# Patient Record
Sex: Female | Born: 1995 | Race: Black or African American | Hispanic: No | Marital: Single | Smoking: Never smoker
Health system: Southern US, Community
[De-identification: ages and names within clinical notes are randomized; demographics above are authoritative.]

## PROBLEM LIST (undated history)

## (undated) ENCOUNTER — Inpatient Hospital Stay (HOSPITAL_COMMUNITY): Payer: Self-pay

## (undated) DIAGNOSIS — G40909 Epilepsy, unspecified, not intractable, without status epilepticus: Secondary | ICD-10-CM

## (undated) DIAGNOSIS — R569 Unspecified convulsions: Secondary | ICD-10-CM

## (undated) DIAGNOSIS — H9190 Unspecified hearing loss, unspecified ear: Secondary | ICD-10-CM

## (undated) HISTORY — PX: NO PAST SURGERIES: SHX2092

---
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
## 2013-01-01 NOTE — ED Provider Notes (Signed)
Medical screening examination/treatment/procedure(s) were performed by non-physician practitioner and as supervising physician I was immediately available for consultation/collaboration.  Doug Sou, MD 01/01/13 1015

## 2015-08-02 ENCOUNTER — Inpatient Hospital Stay (HOSPITAL_COMMUNITY)
Admission: AD | Admit: 2015-08-02 | Discharge: 2015-08-02 | Disposition: A | Discharge status: Home / Self Care | Payer: Medicaid Other | Source: Ambulatory Visit | Attending: Family Medicine | Admitting: Family Medicine

## 2015-08-02 ENCOUNTER — Encounter (HOSPITAL_COMMUNITY): Payer: Self-pay | Admitting: *Deleted

## 2015-08-02 DIAGNOSIS — O2341 Unspecified infection of urinary tract in pregnancy, first trimester: Secondary | ICD-10-CM | POA: Insufficient documentation

## 2015-08-02 DIAGNOSIS — O219 Vomiting of pregnancy, unspecified: Secondary | ICD-10-CM

## 2015-08-02 DIAGNOSIS — Z3A01 Less than 8 weeks gestation of pregnancy: Secondary | ICD-10-CM | POA: Insufficient documentation

## 2015-08-02 DIAGNOSIS — R112 Nausea with vomiting, unspecified: Secondary | ICD-10-CM | POA: Insufficient documentation

## 2015-08-02 DIAGNOSIS — G40909 Epilepsy, unspecified, not intractable, without status epilepticus: Secondary | ICD-10-CM | POA: Insufficient documentation

## 2015-08-02 DIAGNOSIS — O26891 Other specified pregnancy related conditions, first trimester: Secondary | ICD-10-CM | POA: Insufficient documentation

## 2015-08-02 LAB — URINALYSIS, ROUTINE W REFLEX MICROSCOPIC
GLUCOSE, UA: NEGATIVE mg/dL
NITRITE: NEGATIVE
PH: 6 (ref 5.0–8.0)
PROTEIN: 30 mg/dL — AB
Specific Gravity, Urine: >1.03 — ABNORMAL HIGH (ref 1.005–1.030)

## 2015-08-02 LAB — POCT PREGNANCY, URINE: Preg Test, Ur: POSITIVE — AB

## 2015-08-02 LAB — URINE MICROSCOPIC-ADD ON

## 2015-08-02 MED ORDER — PROMETHAZINE HCL 12.5 MG PO TABS: 12.5000 mg | ORAL_TABLET | Freq: Every day | ORAL | Status: AC

## 2015-08-02 MED ORDER — METOCLOPRAMIDE HCL 10 MG PO TABS: 10.0000 mg | ORAL_TABLET | Freq: Three times a day (TID) | ORAL | Status: AC

## 2015-08-02 MED ORDER — DEXTROSE 5 % IN LACTATED RINGERS IV BOLUS
1000.0000 mL | Freq: Once | INTRAVENOUS | Status: AC
  Administered 2015-08-02: 1000 mL via INTRAVENOUS

## 2015-08-02 MED ORDER — PROMETHAZINE HCL 25 MG/ML IJ SOLN
12.5000 mg | Freq: Once | INTRAMUSCULAR | Status: AC
  Administered 2015-08-02: 12.5 mg via INTRAVENOUS
  Filled 2015-08-02: qty 1

## 2015-08-02 MED ORDER — CEPHALEXIN 500 MG PO CAPS: 500.0000 mg | ORAL_CAPSULE | Freq: Four times a day (QID) | ORAL | Status: AC

## 2015-08-02 MED ORDER — M.V.I. ADULT IV INJ
Freq: Once | INTRAVENOUS | Status: AC
  Administered 2015-08-02: 20:00:00 via INTRAVENOUS
  Filled 2015-08-02: qty 1000

## 2015-08-02 NOTE — Discharge Instructions (Signed)

## 2015-08-02 NOTE — MAU Note (Addendum)
Been nauseated, throwing up several times a day, started 1-2 wks ago.  +HPT week ago.  Wanting to see if she is pregnant and see if everything is ok.

## 2015-08-02 NOTE — MAU Provider Note (Signed)
History     CSN: [id number]  Arrival date and time: 08/02/15 1705   None     Chief Complaint  Patient presents with  . Nausea  . Possible Pregnancy   HPI   Becky Harrison is a 20 yo G1P0 at [redacted]w[redacted]d gestation presenting for severe nausea and vomiting for the past two weeks. She admits to anorexia, heartburn and fever, but denies abdominal pain, HA, dizziness, or changes in urination. She had a +HPT two weeks ago and is wondering today if her vomiting can be attributed to a pregnancy.   She denies vaginal bleeding.  She has not started prenatal care.   OB History    Gravida Para Term Preterm AB TAB SAB Ectopic Multiple Living   1               Past Medical History  Diagnosis Date  . Seizures (HCC)   . Hearing impaired   . Epilepsia [facility])     Past Surgical History  Procedure Laterality Date  . No past surgeries      Family History  Problem Relation Age of Onset  . Asthma Brother     Social History  Substance Use Topics  . Smoking status: Never Smoker   . Smokeless tobacco: None  . Alcohol Use: No    Allergies: No Known Allergies  Prescriptions prior to admission  Medication Sig Dispense Refill Last Dose  . acetaminophen (TYLENOL) 325 MG tablet Take 650 mg by mouth every 6 (six) hours as needed for pain.   Past Month at Unknown  . ibuprofen (ADVIL,MOTRIN) 200 MG tablet Take 200 mg by mouth every 6 (six) hours as needed for pain.   Past Month at Unknown  . naproxen sodium (ANAPROX) 220 MG tablet Take 220 mg by mouth daily as needed (for pain).   Past Month at Unknown  . triamcinolone (KENALOG) 0.025 % ointment Apply topically 2 (two) times daily. Do not apply to face 15 g 1    Results for orders placed or performed during the hospital encounter of 08/02/15 (from the past 48 hour(s))  Urinalysis, Routine w reflex microscopic (not at [facility])     Status: Abnormal   Collection Time: 08/02/15  6:10 PM  Result Value Ref Range   Color, Urine YELLOW YELLOW   APPearance  HAZY (A) CLEAR   Specific Gravity, Urine >1.030 (H) 1.005 - 1.030   pH 6.0 5.0 - 8.0   Glucose, UA NEGATIVE NEGATIVE mg/dL   Hgb urine dipstick TRACE (A) NEGATIVE   Bilirubin Urine SMALL (A) NEGATIVE   Ketones, ur >80 (A) NEGATIVE mg/dL   Protein, ur 30 (A) NEGATIVE mg/dL   Nitrite NEGATIVE NEGATIVE   Leukocytes, UA MODERATE (A) NEGATIVE  Urine microscopic-add on     Status: Abnormal   Collection Time: 08/02/15  6:10 PM  Result Value Ref Range   Squamous Epithelial / LPF 0-5 (A) NONE SEEN   WBC, UA 0-5 0 - 5 WBC/hpf   RBC / HPF 0-5 0 - 5 RBC/hpf   Bacteria, UA MANY (A) NONE SEEN   Urine-Other MUCOUS PRESENT   Pregnancy, urine POC     Status: Abnormal   Collection Time: 08/02/15  6:17 PM  Result Value Ref Range   Preg Test, Ur POSITIVE (A) NEGATIVE    Comment:        THE SENSITIVITY OF THIS METHODOLOGY IS >24 mIU/mL     Review of Systems  Constitutional: Positive for fever. Negative for chills.  Respiratory: Negative for cough and shortness of breath.   Gastrointestinal: Positive for heartburn, nausea and vomiting.  Genitourinary: Positive for dysuria, urgency and frequency. Negative for flank pain.  Musculoskeletal: Negative for back pain.  Neurological: Negative for dizziness and headaches.   Physical Exam   Blood pressure 113/56, pulse 66, temperature 98.3 F (36.8 C), temperature source Oral, resp. rate 18, height  (1.6 m), weight 137 lb (62.143 kg), last menstrual period 06/08/2015.  Physical Exam  Constitutional: She is oriented to person, place, and time. She appears well-developed and well-nourished. No distress.  HENT:  Head: Normocephalic.  Cardiovascular: Normal rate and normal heart sounds.   Respiratory: Effort normal and breath sounds normal. No respiratory distress.  GI: Soft. She exhibits no distension. There is no tenderness. There is no rebound.  Musculoskeletal: Normal range of motion.  Neurological: She is alert and oriented to person, place,  and time.  Skin: Skin is warm. She is not diaphoretic.  Psychiatric: Her behavior is normal.    MAU Course  Procedures  None  MDM  Urine culture pending D5 bolus Multivitamins bolus.  Phenergan 12.5 mg IV Pepcid IV  Report to Becky Harrison CNM who resumes care of the patient   Becky Lope, NP   Assessment and Plan   A:  1. UTI in pregnancy, first trimester   2. Nausea and vomiting during pregnancy      P: DC home Comfort measures reviewed  1st Trimester precautions  RX: Reglan, Keflex, Phenergan at night only  Follow-up Information    Schedule an appointment as soon as possible for a visit with [facility].   Specialty:  Obstetrics and Gynecology   Contact information:   [address] [place] [address] [phone]

## 2015-08-04 LAB — CULTURE, OB URINE: Special Requests: NORMAL

## 2015-08-31 ENCOUNTER — Encounter: Payer: Medicaid Other | Admitting: Family

## 2015-08-31 ENCOUNTER — Telehealth: Payer: Self-pay | Admitting: Family Medicine

## 2015-08-31 NOTE — Telephone Encounter (Signed)
Called patient about missed appointment. Left a message for her to call about an appointment she missed.

## 2015-09-20 ENCOUNTER — Encounter: Payer: Medicaid Other | Admitting: Student

## 2015-09-21 ENCOUNTER — Encounter (HOSPITAL_COMMUNITY): Payer: Self-pay | Admitting: *Deleted

## 2015-09-21 ENCOUNTER — Inpatient Hospital Stay (HOSPITAL_COMMUNITY)
Admission: AD | Admit: 2015-09-21 | Discharge: 2015-09-21 | Disposition: A | Discharge status: Home / Self Care | Payer: Self-pay | Source: Ambulatory Visit | Attending: Family Medicine | Admitting: Family Medicine

## 2015-09-21 ENCOUNTER — Inpatient Hospital Stay (HOSPITAL_COMMUNITY): Payer: Self-pay

## 2015-09-21 DIAGNOSIS — Z3A08 8 weeks gestation of pregnancy: Secondary | ICD-10-CM | POA: Insufficient documentation

## 2015-09-21 DIAGNOSIS — O26891 Other specified pregnancy related conditions, first trimester: Secondary | ICD-10-CM | POA: Insufficient documentation

## 2015-09-21 DIAGNOSIS — R109 Unspecified abdominal pain: Secondary | ICD-10-CM

## 2015-09-21 DIAGNOSIS — H9190 Unspecified hearing loss, unspecified ear: Secondary | ICD-10-CM | POA: Insufficient documentation

## 2015-09-21 DIAGNOSIS — O209 Hemorrhage in early pregnancy, unspecified: Secondary | ICD-10-CM | POA: Insufficient documentation

## 2015-09-21 DIAGNOSIS — O021 Missed abortion: Secondary | ICD-10-CM | POA: Insufficient documentation

## 2015-09-21 DIAGNOSIS — O26899 Other specified pregnancy related conditions, unspecified trimester: Secondary | ICD-10-CM

## 2015-09-21 LAB — CBC WITH DIFFERENTIAL/PLATELET
BASOS ABS: 0 10*3/uL (ref 0.0–0.1)
BASOS PCT: 0 %
EOS ABS: 0.1 10*3/uL (ref 0.0–0.7)
EOS PCT: 1 %
HCT: 33.8 % — ABNORMAL LOW (ref 36.0–46.0)
Hemoglobin: 11.1 g/dL — ABNORMAL LOW (ref 12.0–15.0)
LYMPHS PCT: 24 %
Lymphs Abs: 1.8 10*3/uL (ref 0.7–4.0)
MCH: 27.6 pg (ref 26.0–34.0)
MCHC: 32.8 g/dL (ref 30.0–36.0)
MCV: 84.1 fL (ref 78.0–100.0)
MONO ABS: 0.5 10*3/uL (ref 0.1–1.0)
Monocytes Relative: 7 %
Neutro Abs: 5 10*3/uL (ref 1.7–7.7)
Neutrophils Relative %: 68 %
PLATELETS: 247 10*3/uL (ref 150–400)
RBC: 4.02 MIL/uL (ref 3.87–5.11)
RDW: 15 % (ref 11.5–15.5)
WBC: 7.5 10*3/uL (ref 4.0–10.5)

## 2015-09-21 LAB — WET PREP, GENITAL
CLUE CELLS WET PREP: NONE SEEN
SPERM: NONE SEEN
TRICH WET PREP: NONE SEEN
YEAST WET PREP: NONE SEEN

## 2015-09-21 LAB — URINALYSIS, ROUTINE W REFLEX MICROSCOPIC
BILIRUBIN URINE: NEGATIVE
GLUCOSE, UA: NEGATIVE mg/dL
KETONES UR: NEGATIVE mg/dL
NITRITE: NEGATIVE
PH: 6.5 (ref 5.0–8.0)
Protein, ur: NEGATIVE mg/dL

## 2015-09-21 LAB — ABO/RH: ABO/RH(D): O POS

## 2015-09-21 LAB — URINE MICROSCOPIC-ADD ON: Bacteria, UA: NONE SEEN

## 2015-09-21 MED ORDER — OXYCODONE-ACETAMINOPHEN 5-325 MG PO TABS
1.0000 | ORAL_TABLET | Freq: Four times a day (QID) | ORAL | Status: AC | PRN
Start: 2015-09-21 — End: ?

## 2015-09-21 MED ORDER — PROMETHAZINE HCL 25 MG PO TABS: 25.0000 mg | ORAL_TABLET | Freq: Four times a day (QID) | ORAL | Status: AC | PRN

## 2015-09-21 MED ORDER — MISOPROSTOL 200 MCG PO TABS: ORAL_TABLET | ORAL | Status: AC

## 2015-09-21 MED ORDER — IBUPROFEN 600 MG PO TABS
600.0000 mg | ORAL_TABLET | Freq: Four times a day (QID) | ORAL | Status: AC | PRN
Start: 2015-09-21 — End: ?

## 2015-09-21 NOTE — Discharge Instructions (Signed)
Incomplete Miscarriage °A miscarriage is the sudden loss of an unborn baby (fetus) before the 20th week of pregnancy. In an incomplete miscarriage, parts of the fetus or placenta (afterbirth) remain in the body.  °Having a miscarriage can be an emotional experience. Talk with your health care provider about any questions you may have about miscarrying, the grieving process, and your future pregnancy plans. °CAUSES  °· Problems with the fetal chromosomes that make it impossible for the baby to develop normally. Problems with the baby's genes or chromosomes are most often the result of errors that occur by chance as the embryo divides and grows. The problems are not inherited from the parents. °· Infection of the cervix or uterus. °· Hormone problems. °· Problems with the cervix, such as having an incompetent cervix. This is when the tissue in the cervix is not strong enough to hold the pregnancy. °· Problems with the uterus, such as an abnormally shaped uterus, uterine fibroids, or congenital abnormalities. °· Certain medical conditions. °· Smoking, drinking alcohol, or taking illegal drugs. °· Trauma. °SYMPTOMS  °· Vaginal bleeding or spotting, with or without cramps or pain. °· Pain or cramping in the abdomen or lower back. °· Passing fluid, tissue, or blood clots from the vagina. °DIAGNOSIS  °Your health care provider will perform a physical exam. You may also have an ultrasound to confirm the miscarriage. Blood or urine tests may also be ordered. °TREATMENT  °· Usually, a dilation and curettage (D&C) procedure is performed. During a D&C procedure, the cervix is widened (dilated) and any remaining fetal or placental tissue is gently removed from the uterus. °· Antibiotic medicines are prescribed if there is an infection. Other medicines may be given to reduce the size of the uterus (contract) if there is a lot of bleeding. °· If you have Rh negative blood and your baby was Rh positive, you will need a Rho (D)  immune globulin shot. This shot will protect any future baby from having Rh blood problems in future pregnancies. °· You may be confined to bed rest. This means you should stay in bed and only get up to use the bathroom. °HOME CARE INSTRUCTIONS  °· Rest as directed by your health care provider. °· Restrict activity as directed by your health care provider. You may be allowed to continue light activity if curettage was not done but you require further treatment. °· Keep track of the number of pads you use each day. Keep track of how soaked (saturated) they are. Record this information. °· Do not  use tampons. °· Do not douche or have sexual intercourse until approved by your health care provider. °· Keep all follow-up appointments for reevaluation and continuing management. °· Only take over-the-counter or prescription medicines for pain, fever, or discomfort as directed by your health care provider. °· Take antibiotic medicine as directed by your health care provider. Make sure you finish it even if you start to feel better. °SEEK IMMEDIATE MEDICAL CARE IF:  °· You experience severe cramps in your stomach, back, or abdomen. °· You have an unexplained temperature (make sure to record these temperatures). °· You pass large clots or tissue (save these for your health care provider to inspect). °· Your bleeding increases. °· You become light-headed, weak, or have fainting episodes. °MAKE SURE YOU:  °· Understand these instructions. °· Will watch your condition. °· Will get help right away if you are not doing well or get worse. °  °This information is not intended to   replace advice given to you by your health care provider. Make sure you discuss any questions you have with your health care provider.   Document Released: 05/22/2005 Document Revised: 06/12/2014 Document Reviewed: 12/19/2012 Elsevier Interactive Patient Education 2016 [employer]   FACTS YOU SHOULD KNOW About Using Cytotec for Miscarriage  WHAT  IS AN EARLY PREGNANCY FAILURE? Once the egg is fertilized with the sperm and begins to develop, it attaches to the lining of the uterus. This early pregnancy tissue may not develop into an embryo (the beginning stage of a baby). Sometimes an embryo does develop but does not continue to grow. These problems can be seen on ultrasound.   MANAGEMNT OF EARLY PREGNANCY FAILURE: About 4 out of 100 (0.25%) women will have a pregnancy loss in her lifetime.  One in five pregnancies is found to be an early pregnancy failure.  There are 3 ways to care for an early pregnancy failure:   (1) Surgery, (2) Medicine, (3) Waiting for you to pass the pregnancy on your own. The decision as to how to proceed after being diagnosed with and early pregnancy failure is an individual one.  The decision can be made only after appropriate counseling.  You need to weigh the pros and cons of the 3 choices. Then you can make the choice that works for you. SURGERY (D&E)  Procedure over in 1 day  Requires being put to sleep  Bleeding may be light  Possible problems during surgery, including injury to womb(uterus)  Care provider has more control Medicine (CYTOTEC)  The complete procedure may take days to weeks  No Surgery  Bleeding may be heavy at times  There may be drug side effects  Patient has more control Waiting  You may choose to wait, in which case your own body may complete the passing of the abnormal early pregnancy on its own in about 2-4 weeks  Your bleeding may be heavy at times  There is a small possibility that you may need surgery if the bleeding is too much or not all of the pregnancy has passed. CYTOTEC MANAGEMENT Prostaglandins (cytotec) are the most widely used drug for this purpose. They cause the uterus to cramp and contract. You will place the medicine yourself inside your vagina in the privacy of your home. Empting of the uterus should occur within 3 days but the process may continue for  several weeks. The bleeding may seem heavy at times. POSSIBLE SIDE EFFECTS FROM CYTOTEC  Nausea   Vomiting  Diarrhea Fever  Chills  Hot Flashes Side effects  from the process of the early pregnancy failure include:  Cramping  Bleeding  Headaches  Dizziness RISKS: This is a low risk procedure. Less than 1 in 100 women has a complication. An incomplete passage of the early pregnancy may occur. Also, Hemorrhage (heavy bleeding) could happen.  Rarely the pregnancy will not be passed completely. Excessively heavy bleeding may occur.  Your doctor may need to perform surgery to empty the uterus (D&E). Afterwards: Everybody will feel differently after the early pregnancy completion. You may have soreness or cramps for a day or two. You may have soreness or cramps for day or two.  You may have light bleeding for up to 2 weeks. You may be as active as you feel like being. If you have any of the following problems you may call Maternity Admissions Unit at [phone].  If you have pain that does not get better  with pain medication  Bleeding  that soaks through 2 thick full-sized sanitary pads in an hour  Cramps that last longer than 2 days  Foul smelling discharge  Fever above 100.4 degrees F Even if you do not have any of these symptoms, you should have a follow-up exam to make sure you are healing properly. This appointment will be made for you before you leave the hospital. Your next normal period will start again in 4-6 week after the loss. You can get pregnant soon after the loss, so use birth control right away. Finally: Make sure all your questions are answered before during and after any procedure. Follow up with medical care and family planning methods.

## 2015-09-21 NOTE — MAU Provider Note (Signed)
Chief Complaint: Abdominal Pain and Vaginal Bleeding   First Provider Initiated Contact with Patient 09/21/15 1618     SUBJECTIVE   Becky Harrison is a 20 y.o. G1P0 at 3268w0d by LMP who presents to maternity admissions reporting lower abdominal and low back pain since 2 days ago.  Had some spotting that started yesterday. She denies vaginal bleeding, vaginal itching/burning, urinary symptoms, h/a, dizziness, n/v, or fever/chills.    Abdominal Cramping This is a new problem. The current episode started yesterday. The onset quality is gradual. The problem occurs intermittently. The problem has been unchanged. The pain is located in the LLQ, RLQ and suprapubic region. The pain is mild. The quality of the pain is cramping. The abdominal pain does not radiate. Pertinent negatives include no constipation, diarrhea, dysuria, fever, frequency, myalgias, nausea or vomiting. Nothing aggravates the pain. The pain is relieved by nothing. She has tried nothing for the symptoms.  Vaginal Bleeding The patient's primary symptoms include pelvic pain and vaginal bleeding. The patient's pertinent negatives include no genital itching, genital odor, genital rash or vaginal discharge. This is a new problem. The current episode started yesterday. The problem occurs intermittently. The problem has been unchanged. The pain is mild. The problem affects both sides. She is pregnant. Associated symptoms include abdominal pain. Pertinent negatives include no back pain, chills, constipation, diarrhea, dysuria, fever, frequency, nausea or vomiting. The vaginal discharge was bloody. The vaginal bleeding is spotting. She has not been passing clots. She has not been passing tissue. Nothing aggravates the symptoms. She has tried nothing for the symptoms. She uses nothing for contraception.   RN Note: hasn't been feeling well. Having pain in abd and lower back; started 2 days ago; of fand on. Has been spotting- started yesterday          Past Medical History  Diagnosis Date  . Seizures (HCC)   . Hearing impaired   . 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 No past surgeries     Social History   Social History  . Marital Status: Single    Spouse Name: N/A  . Number of Children: N/A  . Years of Education: N/A   Occupational History  . Not on file.   Social History Main Topics  . Smoking status: Never Smoker   . Smokeless tobacco: Not on file  . Alcohol Use: No  . Drug Use: No  . Sexual Activity: Yes    Birth Control/ Protection: Condom   Other Topics Concern  . Not on file   Social History Narrative   No current facility-administered medications on file prior to encounter.   Current Outpatient Prescriptions on File Prior to Encounter  Medication Sig Dispense Refill  . cephALEXin (KEFLEX) 500 MG capsule Take 1 capsule (500 mg total) by mouth 4 (four) times daily. (Patient not taking: Reported on 09/21/2015) 20 capsule 0  . metoCLOPramide (REGLAN) 10 MG tablet Take 1 tablet (10 mg total) by mouth 3 (three) times daily before meals. (Patient not taking: Reported on 09/21/2015) 30 tablet 0  . promethazine (PHENERGAN) 12.5 MG tablet Take 1 tablet (12.5 mg total) by mouth at bedtime. (Patient not taking: Reported on 09/21/2015) 30 tablet 0   No Known Allergies  I have reviewed patient's Past Medical Hx, Surgical Hx, Family Hx, Social Hx, medications and allergies.   ROS:  Review of Systems  Constitutional: Negative for fever and chills.  Gastrointestinal: Positive for abdominal pain. Negative for nausea,  vomiting, diarrhea and constipation.  Genitourinary: Positive for vaginal bleeding and pelvic pain. Negative for dysuria, frequency, vaginal discharge and vaginal pain.  Musculoskeletal: Negative for myalgias and back pain.  Neurological: Negative for dizziness.   Other systems negative  Physical Exam  Patient Vitals for the past 24 hrs:  BP Temp Temp src Pulse  Resp Weight  09/21/15 1424 115/70 mmHg 98 F (36.7 C) Oral 77 18 68.312 kg (150 lb 9.6 oz)   Physical Exam  Constitutional: Well-developed, well-nourished female in no acute distress.  Cardiovascular: normal rate Respiratory: normal effort GI: Abd soft, non-tender. Pos BS x 4 MS: Extremities nontender, no edema, normal ROM Neurologic: Alert and oriented x 4.  GU: Neg CVAT.  PELVIC EXAM: Cervix pink, visually closed, without lesion, scant red discharge, vaginal walls and external genitalia normal Bimanual exam: Cervix 0/long/high, firm, anterior, neg CMT, uterus nontender, nonenlarged, adnexa without tenderness, enlargement, or mass   LAB RESULTS Results for orders placed or performed during the hospital encounter of 09/21/15 (from the past 24 hour(s))  Urinalysis, Routine w reflex microscopic (not at [facility])     Status: Abnormal   Collection Time: 09/21/15  2:25 PM  Result Value Ref Range   Color, Urine STRAW (A) YELLOW   APPearance CLEAR CLEAR   Specific Gravity, Urine <1.005 (L) 1.005 - 1.030   pH 6.5 5.0 - 8.0   Glucose, UA NEGATIVE NEGATIVE mg/dL   Hgb urine dipstick SMALL (A) NEGATIVE   Bilirubin Urine NEGATIVE NEGATIVE   Ketones, ur NEGATIVE NEGATIVE mg/dL   Protein, ur NEGATIVE NEGATIVE mg/dL   Nitrite NEGATIVE NEGATIVE   Leukocytes, UA SMALL (A) NEGATIVE  Urine microscopic-add on     Status: Abnormal   Collection Time: 09/21/15  2:25 PM  Result Value Ref Range   Squamous Epithelial / LPF 0-5 (A) NONE SEEN   WBC, UA 6-30 0 - 5 WBC/hpf   RBC / HPF 0-5 0 - 5 RBC/hpf   Bacteria, UA NONE SEEN NONE SEEN  CBC with Differential/Platelet     Status: Abnormal   Collection Time: 09/21/15  4:25 PM  Result Value Ref Range   WBC 7.5 4.0 - 10.5 K/uL   RBC 4.02 3.87 - 5.11 MIL/uL   Hemoglobin 11.1 (L) 12.0 - 15.0 g/dL   HCT 16.1 (L) 09.6 - 04.5 %   MCV 84.1 78.0 - 100.0 fL   MCH 27.6 26.0 - 34.0 pg   MCHC 32.8 30.0 - 36.0 g/dL   RDW 40.9 81.1 - 91.4 %   Platelets 247 150 -  400 K/uL   Neutrophils Relative % 68 %   Neutro Abs 5.0 1.7 - 7.7 K/uL   Lymphocytes Relative 24 %   Lymphs Abs 1.8 0.7 - 4.0 K/uL   Monocytes Relative 7 %   Monocytes Absolute 0.5 0.1 - 1.0 K/uL   Eosinophils Relative 1 %   Eosinophils Absolute 0.1 0.0 - 0.7 K/uL   Basophils Relative 0 %   Basophils Absolute 0.0 0.0 - 0.1 K/uL  ABO/Rh     Status: None (Preliminary result)   Collection Time: 09/21/15  4:25 PM  Result Value Ref Range   ABO/RH(D) O POS   Wet prep, genital     Status: Abnormal   Collection Time: 09/21/15  4:52 PM  Result Value Ref Range   Yeast Wet Prep HPF POC NONE SEEN NONE SEEN   Trich, Wet Prep NONE SEEN NONE SEEN   Clue Cells Wet Prep HPF POC NONE SEEN NONE  SEEN   WBC, Wet Prep HPF POC FEW (A) NONE SEEN   Sperm NONE SEEN        IMAGING US Ob Comp Less 14 Wks  09/21/2015  CLINICAL DATA:  Pain and vaginal bleeding since yesterday. EXAM: OBSTETRIC <14 WK [place] AND TRANSVAGINAL OB US TECHNIQUE: Both transabdominal and transvaginal ultrasound examinations were performed for complete evaluation of the gestation as well as the maternal uterus, adnexal regions, and pelvic cul-de-sac. Transvaginal technique was performed to assess early pregnancy. COMPARISON:  None. FINDINGS: Intrauterine gestational sac: Visualized. Gestational sac is irregular in shape with either an internal septation or a fluid collection superficial to the gestational sac. Yolk sac:  Nonvisualized. Embryo:  Visualized. Cardiac Activity: Not visualized. MSD: 51.0  mm   10 w   6  d CRL:  18.6  mm   8 w   3 d                  [place] EDC: 04/29/2016 Subchorionic hemorrhage:  No definite subchorionic hemorrhage. Maternal uterus/adnexae: Unremarkable. IMPRESSION: Irregular gestational sac with visualized embryo of crown-rump length 18.6 mm but no associated fetal heart motion. Findings meet definitive criteria for failed pregnancy. This follows SRU consensus guidelines: Diagnostic Criteria for Nonviable Pregnancy Early  in the First Trimester. [place] Mis J Med [phone]. Electronically Signed   By: Kennith Center M.D.   On: 09/21/2015 17:32   US Ob Transvaginal  09/21/2015  CLINICAL DATA:  Pain and vaginal bleeding since yesterday. EXAM: OBSTETRIC <14 WK [place] AND TRANSVAGINAL OB US TECHNIQUE: Both transabdominal and transvaginal ultrasound examinations were performed for complete evaluation of the gestation as well as the maternal uterus, adnexal regions, and pelvic cul-de-sac. Transvaginal technique was performed to assess early pregnancy. COMPARISON:  None. FINDINGS: Intrauterine gestational sac: Visualized. Gestational sac is irregular in shape with either an internal septation or a fluid collection superficial to the gestational sac. Yolk sac:  Nonvisualized. Embryo:  Visualized. Cardiac Activity: Not visualized. MSD: 51.0  mm   10 w   6  d CRL:  18.6  mm   8 w   3 d                  [place] EDC: 04/29/2016 Subchorionic hemorrhage:  No definite subchorionic hemorrhage. Maternal uterus/adnexae: Unremarkable. IMPRESSION: Irregular gestational sac with visualized embryo of crown-rump length 18.6 mm but no associated fetal heart motion. Findings meet definitive criteria for failed pregnancy. This follows SRU consensus guidelines: Diagnostic Criteria for Nonviable Pregnancy Early in the First Trimester. [employer] Mis J Med [phone]. Electronically Signed   By: Kennith Center M.D.   On: 09/21/2015 17:32    MAU Management/MDM:  CBC, ABO/Rh ordered Pelvic exam and cultures done Will check baseline Ultrasound to rule out ectopic.    This bleeding/pain could have represented a normal pregnancy with bleeding, spontaneous abortion or even an ectopic which can be life-threatening.  The process as listed above helps to determine which of these is present.  Discussed findings of [place] (missed abortion).  Discussed expectant management, Cytotec and D&C.  Explained D&C is a last resort and not a good option as a first choice right now.   She opts for Cytotec management. R&B reviewed. Process reviewed  ASSESSMENT SIUP at [redacted]w[redacted]d by LMP, [redacted]w[redacted]d by [place] Missed abortion  PLAN Discharge home Early Intrauterine Pregnancy Failure Protocol  X Documented intrauterine pregnancy failure less than or equal to [redacted] weeks gestation  X No serious current illness  X  Baseline Hgb greater than or equal to 10g/dl  X Patient has easily accessible transportation to the hospital  X Clear preference  X Practitioner/physician deems patient reliable  X Counseling by practitioner or physician  X Patient education by RN  X Consent form signed  Rho-Gam given by RN if indicated   _x_ Cytotec 800 mcg Intravaginally by patient at home  X Ibuprofen 600 mg 1 tablet by mouth every 6 hours as needed #30 - prescribed  X Percocet by mouth every 4 to 6 hours as needed - prescribed  _x_ Phenergan 25 mg by mouth every 4 hours as needed for nausea - prescribed      Medication List    ASK your doctor about these medications        cephALEXin 500 MG capsule  Commonly known as:  KEFLEX  Take 1 capsule (500 mg total) by mouth 4 (four) times daily.     metoCLOPramide 10 MG tablet  Commonly known as:  REGLAN  Take 1 tablet (10 mg total) by mouth 3 (three) times daily before meals.     promethazine 12.5 MG tablet  Commonly known as:  PHENERGAN  Take 1 tablet (12.5 mg total) by mouth at bedtime.        Pt stable at time of discharge. Encouraged to return here or to other Urgent Care/ED if she develops worsening of symptoms, increase in pain, fever, or other concerning symptoms.   Will follow up in clinic in 2-4 weeks (message sent)  Wynelle Bourgeois CNM, MSN Certified Nurse-Midwife 09/21/2015  4:19 PM

## 2015-09-21 NOTE — MAU Note (Signed)
hasn't been feeling well. Having pain in abd and lower back; started 2 days ago; of fand on.  Has been spotting- started yesterday

## 2015-09-22 ENCOUNTER — Other Ambulatory Visit (HOSPITAL_COMMUNITY): Payer: Self-pay | Admitting: Advanced Practice Midwife

## 2015-09-22 ENCOUNTER — Other Ambulatory Visit: Payer: Self-pay | Admitting: Advanced Practice Midwife

## 2015-09-22 LAB — GC/CHLAMYDIA PROBE AMP (~~LOC~~) NOT AT ARMC
Chlamydia: POSITIVE — AB
Neisseria Gonorrhea: NEGATIVE

## 2015-09-22 LAB — HIV ANTIBODY (ROUTINE TESTING W REFLEX): HIV SCREEN 4TH GENERATION: NONREACTIVE

## 2015-09-22 MED ORDER — AZITHROMYCIN 500 MG PO TABS: 1000.0000 mg | ORAL_TABLET | Freq: Once | ORAL | Status: AC

## 2015-09-22 NOTE — Progress Notes (Signed)
+   chlamydia  Rx Sent for Zithromax  Will have Marni call tomorrow to notify patient  Becky BourgeoisMarie Davarion Cuffee

## 2015-09-24 ENCOUNTER — Encounter (HOSPITAL_COMMUNITY): Payer: Self-pay | Admitting: *Deleted

## 2015-10-15 ENCOUNTER — Encounter: Payer: Self-pay | Admitting: Family Medicine

## 2016-06-06 ENCOUNTER — Encounter (HOSPITAL_COMMUNITY): Payer: Self-pay

## 2017-10-10 IMAGING — US US OB TRANSVAGINAL
1 series · 15 of 28 positions shown · non-contrast
Comparison: None.

CLINICAL DATA: Pain and vaginal bleeding since yesterday.

EXAM:
OBSTETRIC <14 WK US AND TRANSVAGINAL OB US
TECHNIQUE: Both transabdominal and transvaginal ultrasound examinations were
performed for complete evaluation of the gestation as well as the
maternal uterus, adnexal regions, and pelvic cul-de-sac.
Transvaginal technique was performed to assess early pregnancy.

[Series 1: us ob transvaginal · 43 acquisitions, 15 frames shown]
[im 1/43]
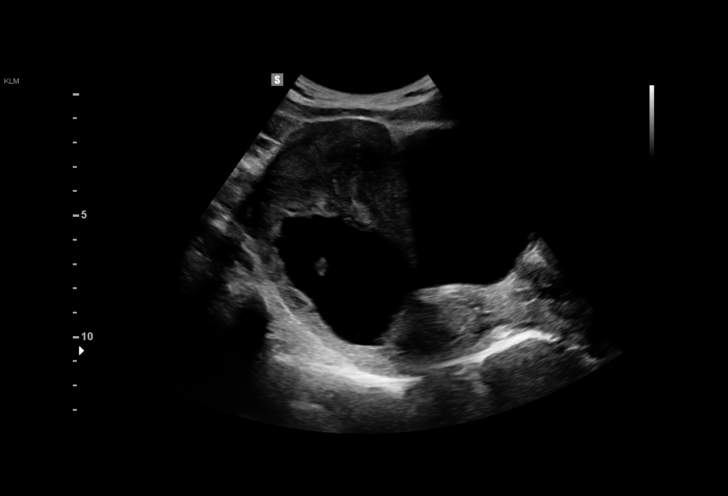
[im 4/43]
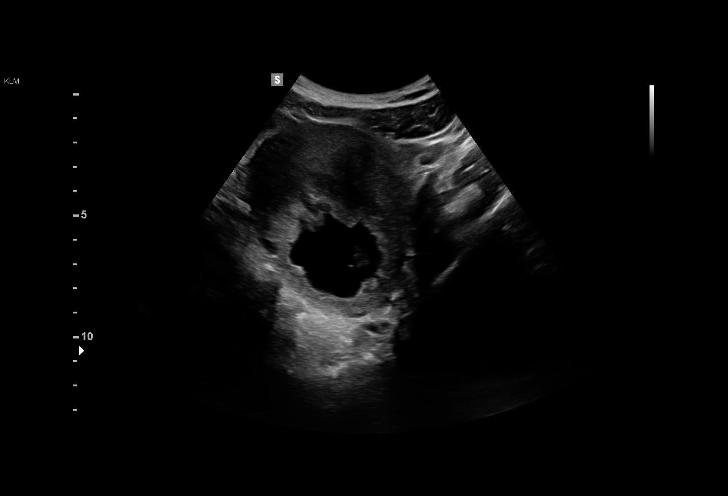
[im 7/43]
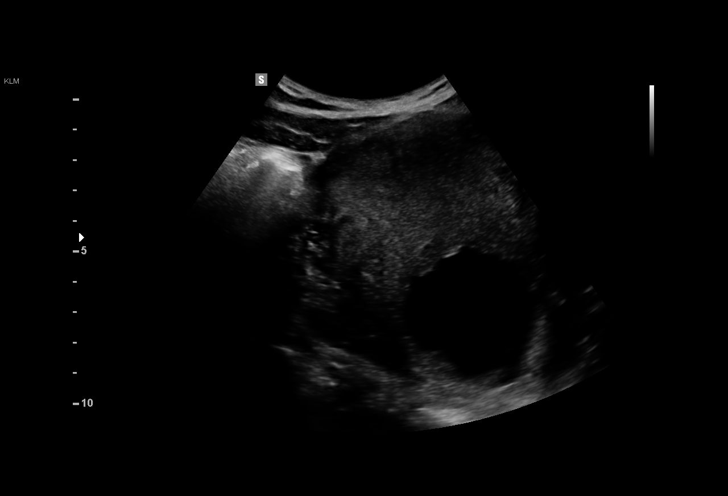
[im 10/43]
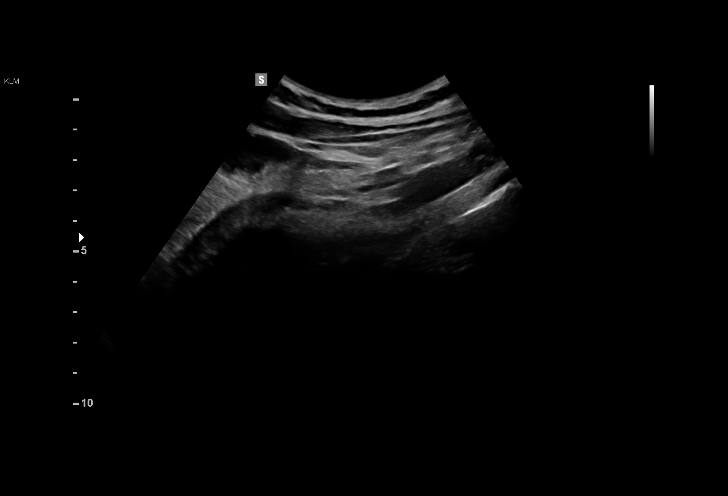
[im 13/43]
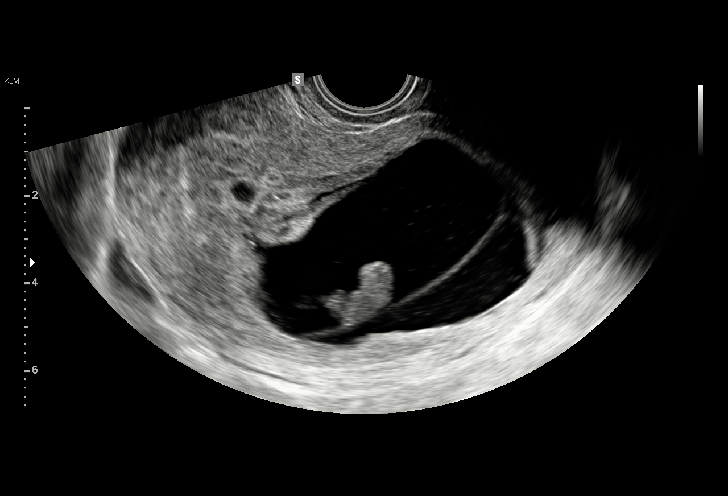
[im 16/43]
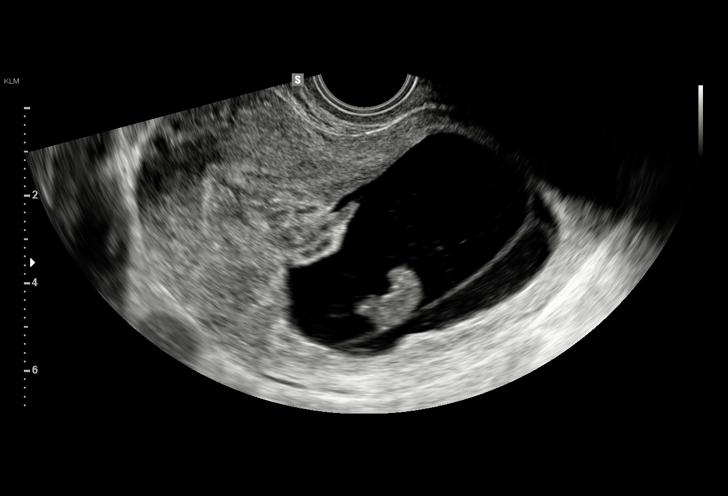
[im 19/43]
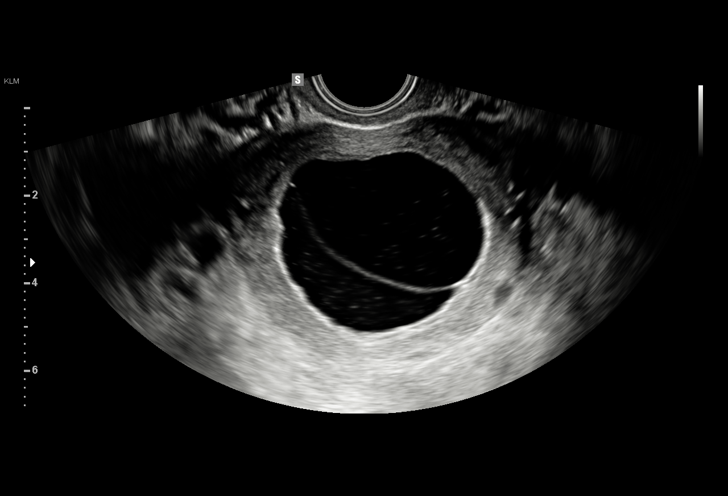
[im 22/43]
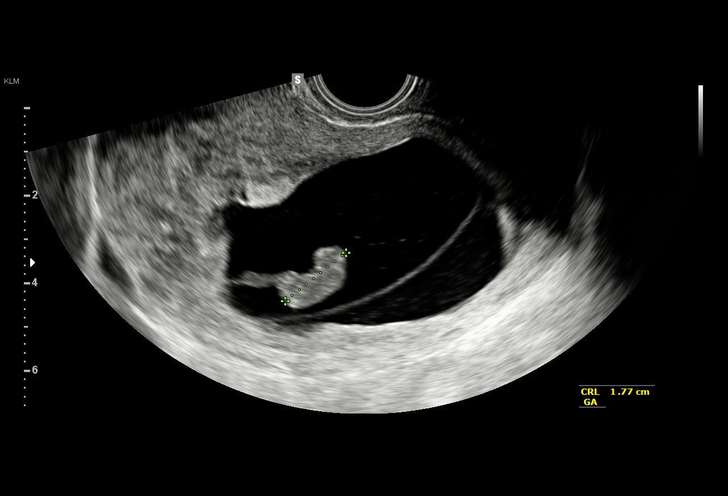
[im 24/43]
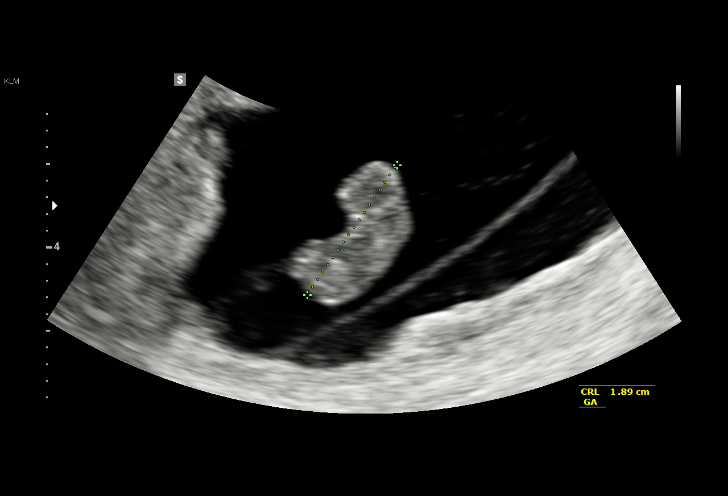
[im 27/43]
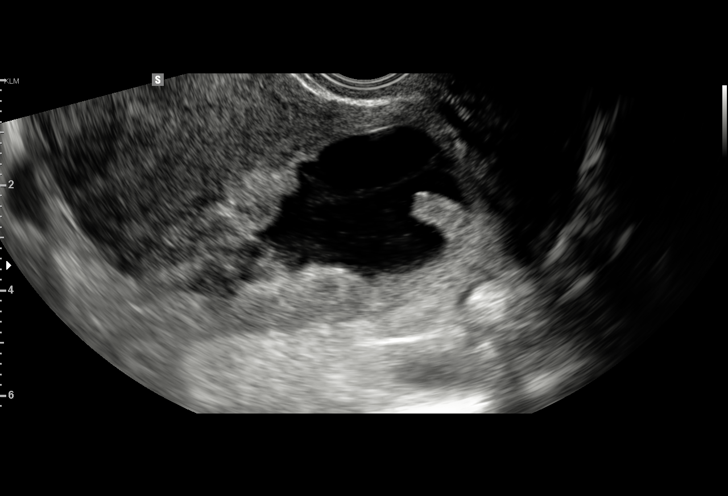
[im 30/43]
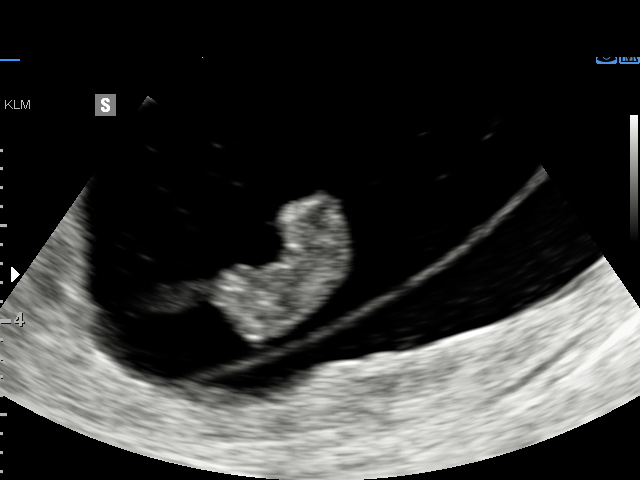
[im 33/43]
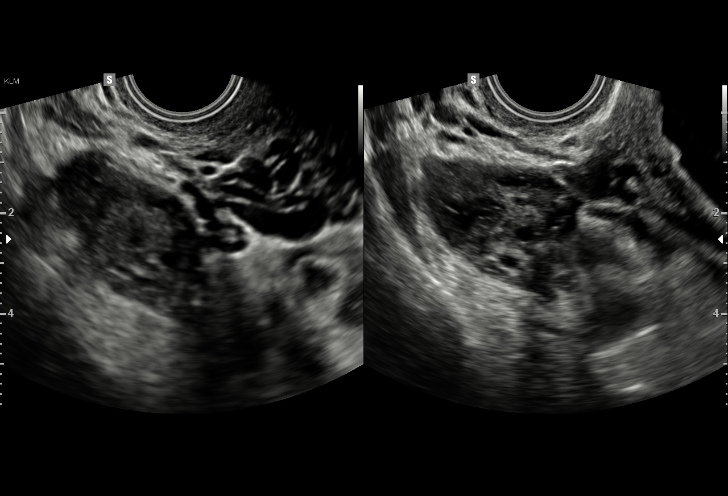
[im 36/43]
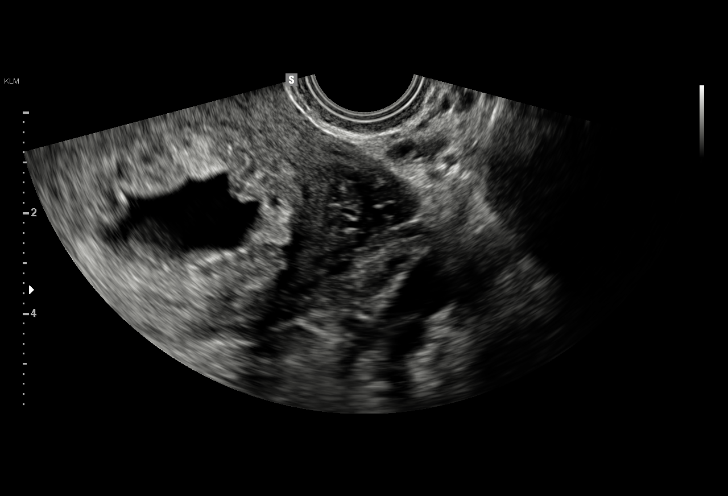
[im 39/43]
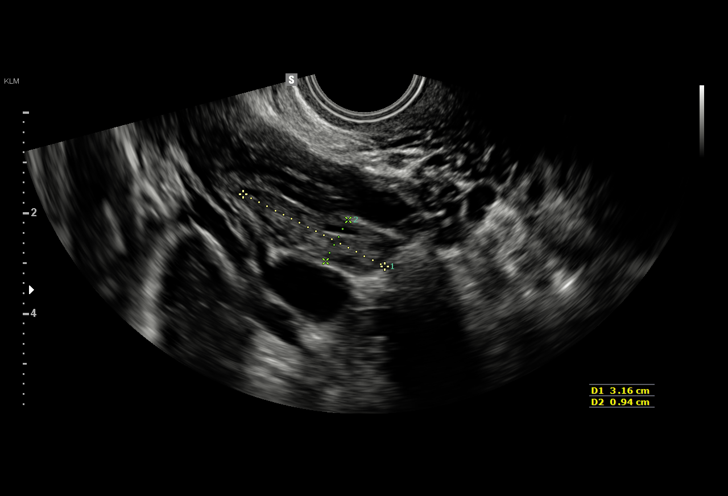
[im 43/43]
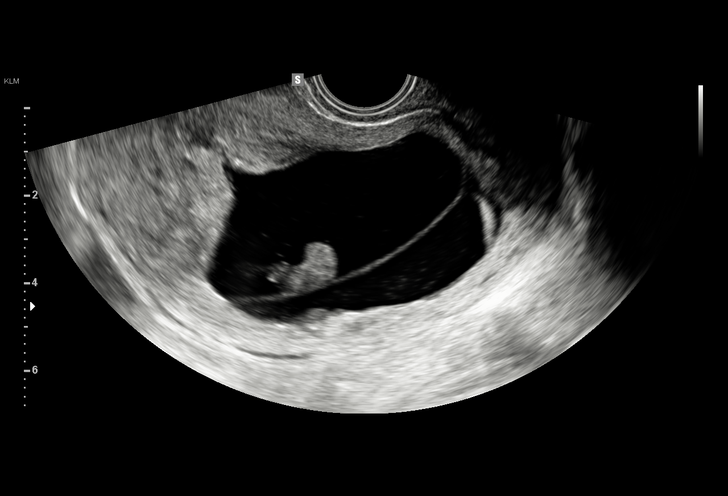

[15 of 28 positions shown; findings below may reference images not displayed]

FINDINGS: Intrauterine gestational sac: Visualized. Gestational sac is
irregular in shape with either an internal septation or a fluid
collection superficial to the gestational sac.

Yolk sac:  Nonvisualized.

Embryo:  Visualized.

Cardiac Activity: Not visualized.

MSD: 51.0  mm   10 w   6  d

CRL:  18.6  mm   8 w   3 d                  US EDC: 04/29/2016

Subchorionic hemorrhage:  No definite subchorionic hemorrhage.

Maternal uterus/adnexae: Unremarkable.
IMPRESSION: Irregular gestational sac with visualized embryo of crown-rump
length 18.6 mm but no associated fetal heart motion. Findings meet
definitive criteria for failed pregnancy. This follows SRU consensus
guidelines: Diagnostic Criteria for Nonviable Pregnancy Early in the
First Trimester. N Engl J Med 8435;[DATE].
